# Patient Record
Sex: Male | Born: 1975 | Race: White | Hispanic: No | Marital: Married | Smoking: Never smoker
Health system: Southern US, Community
[De-identification: ages and names within clinical notes are randomized; demographics above are authoritative.]

## PROBLEM LIST (undated history)

## (undated) DIAGNOSIS — M1990 Unspecified osteoarthritis, unspecified site: Secondary | ICD-10-CM

## (undated) DIAGNOSIS — M797 Fibromyalgia: Secondary | ICD-10-CM

## (undated) HISTORY — PX: SPINAL CORD STIMULATOR INSERTION: SHX5378

## (undated) HISTORY — PX: HIP SURGERY: SHX245

## (undated) HISTORY — PX: TONSILLECTOMY: SUR1361

---

## 2013-10-29 ENCOUNTER — Emergency Department: Payer: Self-pay | Admitting: Emergency Medicine

## 2015-01-11 ENCOUNTER — Encounter: Payer: Self-pay | Admitting: Emergency Medicine

## 2015-01-11 ENCOUNTER — Ambulatory Visit
Admission: EM | Admit: 2015-01-11 | Discharge: 2015-01-11 | Disposition: A | Discharge status: Home / Self Care | Payer: Medicare Other | Attending: Family Medicine | Admitting: Family Medicine

## 2015-01-11 ENCOUNTER — Other Ambulatory Visit: Payer: Self-pay

## 2015-01-11 DIAGNOSIS — R112 Nausea with vomiting, unspecified: Secondary | ICD-10-CM | POA: Diagnosis not present

## 2015-01-11 HISTORY — DX: Unspecified osteoarthritis, unspecified site: M19.90

## 2015-01-11 HISTORY — DX: Fibromyalgia: M79.7

## 2015-01-11 LAB — COMPREHENSIVE METABOLIC PANEL
ALK PHOS: 139 U/L — AB (ref 38–126)
ALT: 55 U/L (ref 17–63)
ANION GAP: 8 (ref 5–15)
AST: 54 U/L — ABNORMAL HIGH (ref 15–41)
Albumin: 4.2 g/dL (ref 3.5–5.0)
BILIRUBIN TOTAL: 0.7 mg/dL (ref 0.3–1.2)
BUN: 10 mg/dL (ref 6–20)
CALCIUM: 9.1 mg/dL (ref 8.9–10.3)
CO2: 30 mmol/L (ref 22–32)
Chloride: 101 mmol/L (ref 101–111)
Creatinine, Ser: 0.72 mg/dL (ref 0.61–1.24)
GFR calc Af Amer: >60 mL/min (ref >60)
GFR calc non Af Amer: >60 mL/min (ref >60)
Glucose, Bld: 117 mg/dL — ABNORMAL HIGH (ref 65–99)
POTASSIUM: 4.1 mmol/L (ref 3.5–5.1)
Sodium: 139 mmol/L (ref 135–145)
TOTAL PROTEIN: 7.4 g/dL (ref 6.5–8.1)

## 2015-01-11 MED ORDER — ONDANSETRON 8 MG PO TBDP
8.0000 mg | ORAL_TABLET | Freq: Once | ORAL | Status: AC
  Administered 2015-01-11: 8 mg via ORAL

## 2015-01-11 MED ORDER — ONDANSETRON 8 MG PO TBDP
ORAL_TABLET | ORAL | Status: AC
Start: 2015-01-11 — End: ?

## 2015-01-11 NOTE — ED Provider Notes (Signed)
CSN: [id number]     Arrival date & time 01/11/15  0818 History   First MD Initiated Contact with Patient 01/11/15 [phone]     Chief Complaint  Patient presents with  . Emesis   (Consider location/radiation/quality/duration/timing/severity/associated sxs/prior Treatment) HPI Comments: 39 yo male with c/o nausea and vomiting since yesterday; states yesterday vomited about 7 times. Today woke up with nausea, headache and shortness of breath. Denies any diarrhea, fevers, chills, dysuria, abdominal pain, hematemesis, sick contacts, suspect food or recent travel.   Patient is a 39 y.o. male presenting with vomiting. The history is provided by the patient.  Emesis Severity:  Moderate Timing:  Constant Quality:  Stomach contents Able to tolerate:  Liquids Progression:  Improving Chronicity:  New Ineffective treatments:  None tried Associated symptoms: headaches   Associated symptoms: no abdominal pain, no arthralgias, no chills, no cough, no diarrhea, no fever, no myalgias, no sore throat and no URI   Risk factors: no travel to endemic areas     Past Medical History  Diagnosis Date  . Fibromyalgia   . Arthritis    Past Surgical History  Procedure Laterality Date  . Hip surgery      x 5  . Spinal cord stimulator insertion    . Tonsillectomy     History reviewed. No pertinent family history. Social History  Substance Use Topics  . Smoking status: Never Smoker   . Smokeless tobacco: Current User  . Alcohol Use: No    Review of Systems  Constitutional: Negative for chills.  HENT: Negative for sore throat.   Gastrointestinal: Positive for vomiting. Negative for abdominal pain and diarrhea.  Musculoskeletal: Negative for myalgias and arthralgias.  Neurological: Positive for headaches.    Allergies  Morphine and related and Ultram  Home Medications   Prior to Admission medications   Medication Sig Start Date End Date Taking? Authorizing Provider  baclofen (LIORESAL) 10 MG  tablet Take 10 mg by mouth 3 (three) times daily.   Yes Historical Provider, MD  budesonide-formoterol (SYMBICORT) 160-4.5 MCG/ACT inhaler Inhale 2 puffs into the lungs 2 (two) times daily.   Yes Historical Provider, MD  fentaNYL (DURAGESIC - DOSED MCG/HR) 25 MCG/HR patch Place 25 mcg onto the skin every 3 (three) days.   Yes Historical Provider, MD  oxycodone (OXY-IR) 5 MG capsule Take 5 mg by mouth every 4 (four) hours as needed.   Yes Historical Provider, MD  pregabalin (LYRICA) 200 MG capsule Take 200 mg by mouth 2 (two) times daily.   Yes Historical Provider, MD  ondansetron (ZOFRAN ODT) 8 MG disintegrating tablet prn 01/11/15   Payton Mccallum, MD   Meds Ordered and Administered this Visit   Medications  ondansetron (ZOFRAN-ODT) disintegrating tablet 8 mg (8 mg Oral Given 01/11/15 0902)    BP 112/82 mmHg  Pulse 81  Temp(Src) 96.5 F (35.8 C)  Resp 18  Ht  (1.778 m)  Wt 223 lb (101.152 kg)  BMI 32.00 kg/m2  SpO2 99% No data found.   Physical Exam  Constitutional: He appears well-developed and well-nourished. No distress.  HENT:  Head: Normocephalic and atraumatic.  Neck: Normal range of motion. Neck supple.  Cardiovascular: Normal rate, regular rhythm and normal heart sounds.   Pulmonary/Chest: Effort normal and breath sounds normal. No respiratory distress. He has no wheezes. He has no rales. He exhibits no tenderness.  Abdominal: Soft. Bowel sounds are normal. He exhibits no distension and no mass. There is no tenderness. There is no rebound  and no guarding.  Musculoskeletal: He exhibits no edema.  Neurological: He is alert.  Skin: No rash noted. He is not diaphoretic.  Nursing note and vitals reviewed.   ED Course  Procedures (including critical care time)  Labs Review Labs Reviewed  COMPREHENSIVE METABOLIC PANEL - Abnormal; Notable for the following:    Glucose, Bld 117 (*)    AST 54 (*)    Alkaline Phosphatase 139 (*)    All other components within normal  limits    Imaging Review No results found.   Visual Acuity Review  Right Eye Distance:   Left Eye Distance:   Bilateral Distance:    Right Eye Near:   Left Eye Near:    Bilateral Near:      EKG: normal EKG, normal sinus rhythm, there are no previous tracings available for comparison.   MDM   1. Nausea and vomiting, vomiting of unspecified type    New Prescriptions   ONDANSETRON (ZOFRAN ODT) 8 MG DISINTEGRATING TABLET    prn   1. Lab results and diagnosis reviewed with patient/parent/guardian/family 2. rx as per orders above; reviewed possible side effects, interactions, risks and benefits  3. Patient given zofran  po x1 with improvement of symptoms 4. Recommend supportive treatment with clear liquid diet, then advance slowly as tolerated 5. Follow prn if symptoms worsen or don't improve    Payton Mccallum, MD 01/11/15 1019

## 2015-01-11 NOTE — ED Notes (Signed)
Patient states he started vomiting yesterday and was nauseated all day. This morning he states he has been having trouble catching his breath and feels "like something is squeezing him around his rib cage."

## 2016-08-10 ENCOUNTER — Emergency Department
Admission: EM | Admit: 2016-08-10 | Discharge: 2016-08-10 | Disposition: A | Discharge status: Home / Self Care | Payer: Medicare Other | Attending: Emergency Medicine | Admitting: Emergency Medicine

## 2016-08-10 ENCOUNTER — Emergency Department: Payer: Medicare Other

## 2016-08-10 DIAGNOSIS — S134XXA Sprain of ligaments of cervical spine, initial encounter: Secondary | ICD-10-CM | POA: Insufficient documentation

## 2016-08-10 DIAGNOSIS — S80211A Abrasion, right knee, initial encounter: Secondary | ICD-10-CM | POA: Diagnosis not present

## 2016-08-10 DIAGNOSIS — S50312A Abrasion of left elbow, initial encounter: Secondary | ICD-10-CM | POA: Insufficient documentation

## 2016-08-10 DIAGNOSIS — S0081XA Abrasion of other part of head, initial encounter: Secondary | ICD-10-CM | POA: Insufficient documentation

## 2016-08-10 DIAGNOSIS — S139XXA Sprain of joints and ligaments of unspecified parts of neck, initial encounter: Secondary | ICD-10-CM

## 2016-08-10 DIAGNOSIS — M898X1 Other specified disorders of bone, shoulder: Secondary | ICD-10-CM

## 2016-08-10 DIAGNOSIS — T07XXXA Unspecified multiple injuries, initial encounter: Secondary | ICD-10-CM

## 2016-08-10 DIAGNOSIS — Y92410 Unspecified street and highway as the place of occurrence of the external cause: Secondary | ICD-10-CM | POA: Insufficient documentation

## 2016-08-10 DIAGNOSIS — Z79899 Other long term (current) drug therapy: Secondary | ICD-10-CM | POA: Diagnosis not present

## 2016-08-10 DIAGNOSIS — S199XXA Unspecified injury of neck, initial encounter: Secondary | ICD-10-CM | POA: Diagnosis present

## 2016-08-10 DIAGNOSIS — Y998 Other external cause status: Secondary | ICD-10-CM | POA: Insufficient documentation

## 2016-08-10 DIAGNOSIS — M25512 Pain in left shoulder: Secondary | ICD-10-CM | POA: Diagnosis not present

## 2016-08-10 DIAGNOSIS — S80212A Abrasion, left knee, initial encounter: Secondary | ICD-10-CM | POA: Diagnosis not present

## 2016-08-10 DIAGNOSIS — Y9355 Activity, bike riding: Secondary | ICD-10-CM | POA: Diagnosis not present

## 2016-08-10 MED ORDER — KETOROLAC TROMETHAMINE 30 MG/ML IJ SOLN
30.0000 mg | Freq: Once | INTRAMUSCULAR | Status: AC
  Administered 2016-08-10: 30 mg via INTRAMUSCULAR
  Filled 2016-08-10: qty 1

## 2016-08-10 MED ORDER — NAPROXEN 500 MG PO TABS: 500.0000 mg | ORAL_TABLET | Freq: Two times a day (BID) | ORAL | 2 refills | Status: AC
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
## 2016-08-10 NOTE — ED Notes (Signed)
Pt reports having right collar bone pain. MD made aware

## 2016-09-24 ENCOUNTER — Emergency Department: Payer: No Typology Code available for payment source

## 2016-09-24 ENCOUNTER — Emergency Department
Admission: EM | Admit: 2016-09-24 | Discharge: 2016-09-25 | Disposition: A | Discharge status: Other Acute Inpatient Hospital | Payer: No Typology Code available for payment source | Attending: Emergency Medicine | Admitting: Emergency Medicine

## 2016-09-24 ENCOUNTER — Encounter: Payer: Self-pay | Admitting: Radiology

## 2016-09-24 ENCOUNTER — Ambulatory Visit (HOSPITAL_COMMUNITY)
Admission: AD | Admit: 2016-09-24 | Discharge: 2016-09-24 | Disposition: A | Discharge status: Home / Self Care | Payer: No Typology Code available for payment source | Source: Other Acute Inpatient Hospital | Attending: Emergency Medicine | Admitting: Emergency Medicine

## 2016-09-24 DIAGNOSIS — Z23 Encounter for immunization: Secondary | ICD-10-CM | POA: Diagnosis not present

## 2016-09-24 DIAGNOSIS — S82899A Other fracture of unspecified lower leg, initial encounter for closed fracture: Secondary | ICD-10-CM

## 2016-09-24 DIAGNOSIS — S2223XA Sternal manubrial dissociation, initial encounter for closed fracture: Secondary | ICD-10-CM | POA: Diagnosis not present

## 2016-09-24 DIAGNOSIS — Y93I9 Activity, other involving external motion: Secondary | ICD-10-CM | POA: Insufficient documentation

## 2016-09-24 DIAGNOSIS — Y929 Unspecified place or not applicable: Secondary | ICD-10-CM | POA: Insufficient documentation

## 2016-09-24 DIAGNOSIS — S728X1A Other fracture of right femur, initial encounter for closed fracture: Secondary | ICD-10-CM

## 2016-09-24 DIAGNOSIS — S8261XA Displaced fracture of lateral malleolus of right fibula, initial encounter for closed fracture: Secondary | ICD-10-CM | POA: Diagnosis not present

## 2016-09-24 DIAGNOSIS — Z79899 Other long term (current) drug therapy: Secondary | ICD-10-CM | POA: Insufficient documentation

## 2016-09-24 DIAGNOSIS — T1490XA Injury, unspecified, initial encounter: Secondary | ICD-10-CM | POA: Insufficient documentation

## 2016-09-24 DIAGNOSIS — S72134A Nondisplaced apophyseal fracture of right femur, initial encounter for closed fracture: Secondary | ICD-10-CM | POA: Insufficient documentation

## 2016-09-24 DIAGNOSIS — R52 Pain, unspecified: Secondary | ICD-10-CM

## 2016-09-24 DIAGNOSIS — R4689 Other symptoms and signs involving appearance and behavior: Secondary | ICD-10-CM

## 2016-09-24 DIAGNOSIS — F17290 Nicotine dependence, other tobacco product, uncomplicated: Secondary | ICD-10-CM | POA: Diagnosis not present

## 2016-09-24 DIAGNOSIS — Y999 Unspecified external cause status: Secondary | ICD-10-CM | POA: Diagnosis not present

## 2016-09-24 DIAGNOSIS — R778 Other specified abnormalities of plasma proteins: Secondary | ICD-10-CM

## 2016-09-24 DIAGNOSIS — R7989 Other specified abnormal findings of blood chemistry: Secondary | ICD-10-CM

## 2016-09-24 DIAGNOSIS — R4182 Altered mental status, unspecified: Secondary | ICD-10-CM | POA: Diagnosis present

## 2016-09-24 LAB — COMPREHENSIVE METABOLIC PANEL
ALT: 54 U/L (ref 17–63)
ANION GAP: 9 (ref 5–15)
AST: 87 U/L — ABNORMAL HIGH (ref 15–41)
Albumin: 3.2 g/dL — ABNORMAL LOW (ref 3.5–5.0)
Alkaline Phosphatase: 123 U/L (ref 38–126)
BUN: 9 mg/dL (ref 6–20)
CHLORIDE: 111 mmol/L (ref 101–111)
CO2: 19 mmol/L — ABNORMAL LOW (ref 22–32)
Calcium: 7 mg/dL — ABNORMAL LOW (ref 8.9–10.3)
Creatinine, Ser: 0.94 mg/dL (ref 0.61–1.24)
Glucose, Bld: 136 mg/dL — ABNORMAL HIGH (ref 65–99)
POTASSIUM: 2.6 mmol/L — AB (ref 3.5–5.1)
Sodium: 139 mmol/L (ref 135–145)
Total Bilirubin: 0.6 mg/dL (ref 0.3–1.2)
Total Protein: 6 g/dL — ABNORMAL LOW (ref 6.5–8.1)

## 2016-09-24 LAB — ETHANOL: Alcohol, Ethyl (B): <5 mg/dL (ref <5)

## 2016-09-24 LAB — URINALYSIS, COMPLETE (UACMP) WITH MICROSCOPIC
BILIRUBIN URINE: NEGATIVE
Bacteria, UA: NONE SEEN
GLUCOSE, UA: NEGATIVE mg/dL
Ketones, ur: NEGATIVE mg/dL
LEUKOCYTES UA: NEGATIVE
NITRITE: NEGATIVE
PH: 6 (ref 5.0–8.0)
Protein, ur: NEGATIVE mg/dL
Specific Gravity, Urine: 1.003 — ABNORMAL LOW (ref 1.005–1.030)

## 2016-09-24 LAB — URINE DRUG SCREEN, QUALITATIVE (ARMC ONLY)
AMPHETAMINES, UR SCREEN: NOT DETECTED
BENZODIAZEPINE, UR SCRN: NOT DETECTED
Barbiturates, Ur Screen: NOT DETECTED
Cannabinoid 50 Ng, Ur ~~LOC~~: POSITIVE — AB
Cocaine Metabolite,Ur ~~LOC~~: NOT DETECTED
MDMA (Ecstasy)Ur Screen: NOT DETECTED
Methadone Scn, Ur: NOT DETECTED
OPIATE, UR SCREEN: NOT DETECTED
PHENCYCLIDINE (PCP) UR S: NOT DETECTED
Tricyclic, Ur Screen: POSITIVE — AB

## 2016-09-24 LAB — BLOOD GAS, ARTERIAL
Acid-base deficit: 1.8 mmol/L (ref 0.0–2.0)
Bicarbonate: 23.1 mmol/L (ref 20.0–28.0)
FIO2: 0.6
MECHVT: 500 mL
O2 Saturation: 99.5 %
PATIENT TEMPERATURE: 37
PCO2 ART: 39 mmHg (ref 32.0–48.0)
PEEP/CPAP: 5 cmH2O
PO2 ART: 169 mmHg — AB (ref 83.0–108.0)
RATE: 16 resp/min
pH, Arterial: 7.38 (ref 7.350–7.450)

## 2016-09-24 LAB — CBC WITH DIFFERENTIAL/PLATELET
BASOS ABS: 0.1 10*3/uL (ref 0–0.1)
Basophils Relative: 0 %
EOS PCT: 2 %
Eosinophils Absolute: 0.2 10*3/uL (ref 0–0.7)
HCT: 31.1 % — ABNORMAL LOW (ref 40.0–52.0)
Hemoglobin: 10.2 g/dL — ABNORMAL LOW (ref 13.0–18.0)
LYMPHS ABS: 1.9 10*3/uL (ref 1.0–3.6)
LYMPHS PCT: 13 %
MCH: 30.8 pg (ref 26.0–34.0)
MCHC: 32.9 g/dL (ref 32.0–36.0)
MCV: 93.7 fL (ref 80.0–100.0)
MONO ABS: 1.1 10*3/uL — AB (ref 0.2–1.0)
Monocytes Relative: 7 %
Neutro Abs: 12.1 10*3/uL — ABNORMAL HIGH (ref 1.4–6.5)
Neutrophils Relative %: 78 %
PLATELETS: 207 10*3/uL (ref 150–440)
RBC: 3.32 MIL/uL — ABNORMAL LOW (ref 4.40–5.90)
RDW: 14.8 % — AB (ref 11.5–14.5)
WBC: 15.4 10*3/uL — ABNORMAL HIGH (ref 3.8–10.6)

## 2016-09-24 LAB — TYPE AND SCREEN
ABO/RH(D): O POS
ANTIBODY SCREEN: NEGATIVE

## 2016-09-24 LAB — SALICYLATE LEVEL: Salicylate Lvl: <7 mg/dL (ref 2.8–30.0)

## 2016-09-24 LAB — ACETAMINOPHEN LEVEL: Acetaminophen (Tylenol), Serum: <10 ug/mL — ABNORMAL LOW (ref 10–30)

## 2016-09-24 LAB — TROPONIN I: TROPONIN I: 0.5 ng/mL — AB (ref <0.03)

## 2016-09-24 MED ORDER — SUCCINYLCHOLINE CHLORIDE 20 MG/ML IJ SOLN
120.0000 mg | Freq: Once | INTRAMUSCULAR | Status: AC
  Administered 2016-09-24: 120 mg via INTRAVENOUS

## 2016-09-24 MED ORDER — SODIUM CHLORIDE 0.9 % IV BOLUS (SEPSIS)
1000.0000 mL | Freq: Once | INTRAVENOUS | Status: AC
  Administered 2016-09-24: 1000 mL via INTRAVENOUS

## 2016-09-24 MED ORDER — IOPAMIDOL (ISOVUE-300) INJECTION 61%
100.0000 mL | Freq: Once | INTRAVENOUS | Status: AC | PRN
  Administered 2016-09-24: 100 mL via INTRAVENOUS

## 2016-09-24 MED ORDER — MIDAZOLAM HCL 2 MG/2ML IJ SOLN
2.0000 mg | Freq: Once | INTRAMUSCULAR | Status: AC
  Administered 2016-09-24: 2 mg via INTRAVENOUS

## 2016-09-24 MED ORDER — ETOMIDATE 2 MG/ML IV SOLN
INTRAVENOUS | Status: AC
  Administered 2016-09-24: 30 mg via INTRAVENOUS
  Filled 2016-09-24: qty 10

## 2016-09-24 MED ORDER — CEFAZOLIN SODIUM-DEXTROSE 1-4 GM/50ML-% IV SOLN
1.0000 g | Freq: Once | INTRAVENOUS | Status: DC
  Filled 2016-09-24: qty 50

## 2016-09-24 MED ORDER — PROPOFOL 10 MG/ML IV BOLUS
60.0000 mg | Freq: Once | INTRAVENOUS | Status: AC
  Administered 2016-09-24: 60 mg via INTRAVENOUS

## 2016-09-24 MED ORDER — POTASSIUM CHLORIDE IN NACL 20-0.9 MEQ/L-% IV SOLN
Freq: Once | INTRAVENOUS | Status: AC
  Administered 2016-09-24: 23:00:00 via INTRAVENOUS
  Filled 2016-09-24: qty 1000

## 2016-09-24 MED ORDER — DIPHENHYDRAMINE HCL 50 MG/ML IJ SOLN
INTRAMUSCULAR | Status: AC
  Administered 2016-09-24: 50 mg
  Filled 2016-09-24: qty 1

## 2016-09-24 MED ORDER — PROPOFOL 1000 MG/100ML IV EMUL
15.0000 ug/kg/min | Freq: Once | INTRAVENOUS | Status: AC
  Administered 2016-09-24: 15 ug/kg/min via INTRAVENOUS

## 2016-09-24 MED ORDER — TETANUS-DIPHTH-ACELL PERTUSSIS 5-2.5-18.5 LF-MCG/0.5 IM SUSP
0.5000 mL | Freq: Once | INTRAMUSCULAR | Status: AC
  Administered 2016-09-25: 0.5 mL via INTRAMUSCULAR
  Filled 2016-09-24: qty 0.5

## 2016-09-24 MED ORDER — PROPOFOL 1000 MG/100ML IV EMUL
INTRAVENOUS | Status: AC
  Administered 2016-09-24: 60 mg via INTRAVENOUS
  Filled 2016-09-24: qty 100

## 2016-09-24 MED ORDER — LORAZEPAM 2 MG/ML IJ SOLN
INTRAMUSCULAR | Status: AC
  Administered 2016-09-24: 2 mg
  Filled 2016-09-24: qty 1

## 2016-09-24 MED ORDER — FENTANYL CITRATE (PF) 100 MCG/2ML IJ SOLN
100.0000 ug | Freq: Once | INTRAMUSCULAR | Status: AC
  Administered 2016-09-24: 100 ug via INTRAVENOUS

## 2016-09-24 MED ORDER — PROPOFOL 10 MG/ML IV BOLUS: 0.5000 mg/kg | Freq: Once | INTRAVENOUS | Status: DC

## 2016-09-24 MED ORDER — ETOMIDATE 2 MG/ML IV SOLN
30.0000 mg | Freq: Once | INTRAVENOUS | Status: AC
  Administered 2016-09-24: 30 mg via INTRAVENOUS

## 2016-09-24 MED ORDER — HALOPERIDOL LACTATE 5 MG/ML IJ SOLN
INTRAMUSCULAR | Status: AC
  Administered 2016-09-24: 5 mg
  Filled 2016-09-24: qty 1
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
## 2016-09-24 NOTE — Progress Notes (Signed)
Pt. Transported to CT on vent without incident.

## 2016-09-24 NOTE — ED Notes (Signed)
Dr Derrill KayGoodman made aware of pt's critical lab values: K+ (2.6), Troponin (0.50).
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
## 2016-09-25 DIAGNOSIS — S72134A Nondisplaced apophyseal fracture of right femur, initial encounter for closed fracture: Secondary | ICD-10-CM | POA: Diagnosis not present

## 2016-09-25 MED ORDER — FENTANYL CITRATE (PF) 100 MCG/2ML IJ SOLN
50.0000 ug | Freq: Once | INTRAMUSCULAR | Status: AC
  Administered 2016-09-25: 50 ug via INTRAVENOUS

## 2016-09-25 MED ORDER — FENTANYL CITRATE (PF) 100 MCG/2ML IJ SOLN
INTRAMUSCULAR | Status: AC
  Filled 2016-09-25: qty 2

## 2016-09-25 MED ORDER — PROPOFOL 1000 MG/100ML IV EMUL
INTRAVENOUS | Status: AC
  Filled 2016-09-25: qty 100

## 2016-09-25 MED FILL — Fentanyl Citrate Preservative Free (PF) Inj 100 MCG/2ML: INTRAMUSCULAR | Qty: 4 | Status: AC

## 2016-09-25 NOTE — ED Notes (Signed)
One unopened bottle of Diprivan ( ) sent with [facility] to have on hand while patient is being transported.

## 2016-11-01 ENCOUNTER — Other Ambulatory Visit: Payer: Self-pay | Admitting: Family Medicine

## 2016-11-01 DIAGNOSIS — R7989 Other specified abnormal findings of blood chemistry: Secondary | ICD-10-CM

## 2016-11-02 ENCOUNTER — Ambulatory Visit
Admission: RE | Admit: 2016-11-02 | Discharge: 2016-11-02 | Disposition: A | Discharge status: Home / Self Care | Payer: Medicare Other | Source: Ambulatory Visit | Attending: Family Medicine | Admitting: Family Medicine

## 2016-11-02 DIAGNOSIS — R918 Other nonspecific abnormal finding of lung field: Secondary | ICD-10-CM | POA: Insufficient documentation

## 2016-11-02 DIAGNOSIS — R7989 Other specified abnormal findings of blood chemistry: Secondary | ICD-10-CM

## 2016-11-02 MED ORDER — IOPAMIDOL (ISOVUE-370) INJECTION 76%
75.0000 mL | Freq: Once | INTRAVENOUS | Status: AC | PRN
  Administered 2016-11-02: 75 mL via INTRAVENOUS
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
